# Patient Record
Sex: Female | Born: 1970 | Race: Black or African American | Hispanic: No | Marital: Single | Smoking: Never smoker
Health system: Southern US, Community
[De-identification: ages and names within clinical notes are randomized; demographics above are authoritative.]

## PROBLEM LIST (undated history)

## (undated) HISTORY — PX: PARTIAL HYSTERECTOMY: SHX80

---

## 1997-12-16 ENCOUNTER — Other Ambulatory Visit: Admission: RE | Admit: 1997-12-16 | Discharge: 1997-12-16 | Payer: Self-pay | Admitting: Obstetrics

## 1998-03-12 ENCOUNTER — Emergency Department (HOSPITAL_COMMUNITY): Admission: EM | Admit: 1998-03-12 | Discharge: 1998-03-12 | Payer: Self-pay | Admitting: Emergency Medicine

## 1998-05-31 ENCOUNTER — Ambulatory Visit (HOSPITAL_COMMUNITY): Admission: RE | Admit: 1998-05-31 | Discharge: 1998-05-31 | Payer: Self-pay | Admitting: Obstetrics

## 1998-07-21 ENCOUNTER — Emergency Department (HOSPITAL_COMMUNITY): Admission: EM | Admit: 1998-07-21 | Discharge: 1998-07-21 | Payer: Self-pay | Admitting: Emergency Medicine

## 2001-04-17 ENCOUNTER — Other Ambulatory Visit: Admission: RE | Admit: 2001-04-17 | Discharge: 2001-04-17 | Payer: Self-pay | Admitting: Obstetrics and Gynecology

## 2003-01-26 ENCOUNTER — Encounter: Payer: Self-pay | Admitting: *Deleted

## 2003-01-26 ENCOUNTER — Inpatient Hospital Stay (HOSPITAL_COMMUNITY): Admission: AD | Admit: 2003-01-26 | Discharge: 2003-01-26 | Payer: Self-pay | Admitting: *Deleted

## 2003-02-17 ENCOUNTER — Other Ambulatory Visit: Admission: RE | Admit: 2003-02-17 | Discharge: 2003-02-17 | Payer: Self-pay | Admitting: Gynecology

## 2003-04-09 ENCOUNTER — Ambulatory Visit (HOSPITAL_COMMUNITY): Admission: RE | Admit: 2003-04-09 | Discharge: 2003-04-09 | Payer: Self-pay | Admitting: Gynecology

## 2003-04-09 ENCOUNTER — Encounter (INDEPENDENT_AMBULATORY_CARE_PROVIDER_SITE_OTHER): Payer: Self-pay

## 2003-04-09 ENCOUNTER — Ambulatory Visit (HOSPITAL_BASED_OUTPATIENT_CLINIC_OR_DEPARTMENT_OTHER): Admission: RE | Admit: 2003-04-09 | Discharge: 2003-04-09 | Payer: Self-pay | Admitting: Gynecology

## 2003-06-16 ENCOUNTER — Observation Stay (HOSPITAL_COMMUNITY): Admission: RE | Admit: 2003-06-16 | Discharge: 2003-06-17 | Payer: Self-pay | Admitting: Gynecology

## 2003-06-16 ENCOUNTER — Encounter (INDEPENDENT_AMBULATORY_CARE_PROVIDER_SITE_OTHER): Payer: Self-pay

## 2003-06-16 ENCOUNTER — Encounter (INDEPENDENT_AMBULATORY_CARE_PROVIDER_SITE_OTHER): Payer: Self-pay | Admitting: Specialist

## 2008-06-01 ENCOUNTER — Ambulatory Visit (HOSPITAL_COMMUNITY): Admission: RE | Admit: 2008-06-01 | Discharge: 2008-06-01 | Payer: Self-pay | Admitting: Family Medicine

## 2008-08-16 ENCOUNTER — Emergency Department (HOSPITAL_COMMUNITY): Admission: EM | Admit: 2008-08-16 | Discharge: 2008-08-16 | Payer: Self-pay | Admitting: Emergency Medicine

## 2010-11-17 NOTE — H&P (Signed)
   NAMEAARIYAH, [name]                          ACCOUNT NO.:  [id number]   MEDICAL RECORD NO.:  [id number]                   PATIENT TYPE:  AMB   LOCATION:  [facility]                                 FACILITY:  [facility]   PHYSICIAN:  Timothy P. Fontaine, M.D.           DATE OF BIRTH:  12/14/1970   DATE OF ADMISSION:  04/09/2003  DATE OF DISCHARGE:                                HISTORY & PHYSICAL   CHIEF COMPLAINT:  Menorrhagia.   HISTORY OF PRESENT ILLNESS:  The patient is a 40 year old G3, P3 female,  status post postpartum tubal sterilization who presents with a history of  one year of worsening periods, increasing dysmenorrhea, and cramping with  heavier flows.  She was evaluated in the emergency room for heavy bleeding,  was found to have a hemoglobin of 8.1.  Her evaluation as an outpatient  included a normal Pap smear, normal prolactin, FSH, and TSH, with a repeated  hemoglobin of 7.4.  She was begun on iron and underwent a sonohistogram  which shows a large intracavitary polyp measuring 26 x 27 x 15 mm.  The  patient is admitted at this time for hysteroscopic evaluation and resection  of the polyp.   PAST MEDICAL HISTORY:  Uncomplicated.   PAST SURGICAL HISTORY:  1. Postpartum tubal sterilization.  2. D&C.   ALLERGIES:  No medications.   CURRENT MEDICATIONS:  None.   REVIEW OF SYSTEMS:  Noncontributory.   SOCIAL HISTORY:  Noncontributory.   FAMILY HISTORY:  Noncontributory.   ADMISSION PHYSICAL EXAMINATION:  VITAL SIGNS:  Afebrile.  Vital signs are  stable.  HEENT:  Normal.  LUNGS:  Clear.  CARDIAC:  Regular rate without murmurs, rubs, or gallops.  ABDOMEN:  Benign.  PELVIC:  External BUS, vagina normal.  Cervix normal.  Uterus normal size,  nontender.  Adnexa without masses or tenderness.  Rectovaginal exam is  normal.   ASSESSMENT:  A 40 year old G3, P3 female, worsening periods, menorrhagia,  with large intracavitary defect consistent with a large polyp for  hysteroscopic evaluation and resection.  Risks, benefits, indications, and  alternatives for the procedure were reviewed with the patient, to include  the expected intraoperative and postoperative courses.  The risks of  bleeding, transfusion, infection, uterine perforation, damage to internal  organs,  including major exploratory reparative surgeries and future reparative  surgeries, including ostomy formation was discussed, understood, and  accepted.  The patient's questions were answered to her satisfaction.  She  is ready to proceed with surgery.                                               Timothy P. Audie Box, M.D.    TPF/MEDQ  D:  04/05/2003  T:  04/05/2003  Job:  [id number]

## 2010-11-17 NOTE — Discharge Summary (Signed)
[name], [name]                          ACCOUNT NO.:  [id number]   MEDICAL RECORD NO.:  [id number]                   PATIENT TYPE:  OBV   LOCATION:  9322                                 FACILITY:  [facility]   PHYSICIAN:  Timothy P. Fontaine, M.D.           DATE OF BIRTH:  April 10, 1971   DATE OF ADMISSION:  06/16/2003  DATE OF DISCHARGE:  06/17/2003                                 DISCHARGE SUMMARY   DISCHARGE DIAGNOSES:  1. 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  2. Anemia status post.  3. Total vaginal hysterectomy with Dr. Audie Box on June 16, 2003.   HISTORY OF PRESENT ILLNESS:  This is a 40 year old female gravida 3, para 3  with history of tubal sterilization with increase in dysmenorrhea and  menorrhagia.  Underwent sonohysterogram which showed a large endometrial  polyp which was resected April 09, 2003.  On pathology results, was  initially concerning which warranted referral to Dr. Doran Durand at [facility].  Review is subsequently sent for Dr. Ursula Beath in [facility] for review.  They found there were some worrisome ____________, and  although they were not willing to make a specific diagnosis for  adenosarcoma, they felt it was very concerning for that.  Therefore, it was  felt to proceed with definitive surgery.   HOSPITAL COURSE:  On June 16, 2003, the patient underwent a total  vaginal hysterectomy without complications by Dr. Colin Broach.  There  was noted to be a small cyst on a pedunculated stalk.  The right fallopian  tube was excised.  Postoperatively, the patient remained febrile and in  stable condition, was discharged to home on June 16, 2003, and given  instructions.  On June 17, 2003, hemoglobin was 8.4, preoperative  hemoglobin on June 14, 2003, was 9.0.  Pathology report of the uterus  and cervix showed no pathologic abnormalities identified for  nonproliferative endometrium.  Myometrium. With no  pathologic abnormalities.  Fallopian tube cyst right pseudocyst.  Benign paratubal cyst and no tumor  identified.   DISPOSITION:  The patient was discharged to home.  Given prescription for  Tylox p.r.n. pain.  Return to office in two weeks.     Susa Loffler, P.A.                    Timothy P. Audie Box, M.D.    Ardath Sax  D:  07/12/2003  T:  07/12/2003  Job:  [id number]

## 2010-11-17 NOTE — H&P (Signed)
[name], [name]                          ACCOUNT NO.:  [id number]   MEDICAL RECORD NO.:  [id number]                   PATIENT TYPE:  OBV   LOCATION:  NA                                   FACILITY:  [facility]   PHYSICIAN:  Timothy P. Fontaine, M.D.           DATE OF BIRTH:  March 24, 1971   DATE OF ADMISSION:  DATE OF DISCHARGE:                                HISTORY & PHYSICAL   ANTICIPATED DATE OF ADMISSION:  Being admitted to [facility] December  15th at 12:30 for surgery.   CHIEF COMPLAINT:  Endometrial polyps suspicious for adenosarcoma.   HISTORY OF PRESENT ILLNESS:  A 40 year old G 3, P 3 female, history of tubal  sterilization with a history of increasing dysmenorrhea and menorrhagia.  The patient underwent a sonohysterogram which showed a large endometrial  polyp.  She subsequently underwent hysteroscopic resection April 09, 2003.  Pathology results were initially concerning which warranted referral to Dr.  Doran Durand at [facility] for review who subsequently sent her to Dr. Ursula Beath at [facility] for review.  They find that there is  some worrisome atypia and although they are not willing to make the ultimate  diagnosis of adenosarcoma they feel that it is very concerning for this.  All of this was discussed with the patient and we all feel it is most  prudent to proceed with a simple hysterectomy for subsequent diagnosis and  ultimate therapy.  The case was reviewed with De Blanch, M.D.  who on history feels that no further surgery than hysterectomy is needed.  That we do not need to remove her ovaries or lymph nodes.  The patient is  admitted at this time for a vaginal hysterectomy.   PAST SURGICAL HISTORY:  Postpartum tubal sterilization.   PAST MEDICAL HISTORY:  None.   No medications.   REVIEW OF SYSTEMS:  Noncontributory.   SOCIAL HISTORY:  Noncontributory.   FAMILY HISTORY:  Noncontributory.   ADMISSION PHYSICAL  EXAMINATION:  HEENT:  Normal.  LUNGS:  Clear.  CARDIAC:  Regular rate without rubs, murmurs, or gallops.  ABDOMEN:  Benign.  PELVIC:  External BUS, vagina normal.  Cervix normal.  Uterus normal size,  nontender.  Adnexa without masses or tenderness.   ASSESSMENT:  A 40 year old G 3, P 3 female status post tubal sterilization,  a large polyp suspicious for adenosarcoma for a hysterectomy.  The risks,  benefits, indications, and alternatives for the procedure were reviewed with  her to include the expected intraoperative, postoperative courses.  I  reviewed with her that there were no guarantees as far as pathology were  concerned.  That we may on final pathology find no further abnormalities in  the uterus, persistence of atypical changes, or frank carcinoma which would  require a subsequent re-operation.  The patient understands these issues and  accepts them.  She also understands there are no guarantees as far as  vaginal approach and that any time during the procedure if it is felt unsafe  to proceed with a vaginal hysterectomy that we would pursue an abdominal  approach and she understands and accepts this.  Have also counseled her that  if there is overt pathology with her ovaries that I may need to remove one  or both ovaries at the time of the procedure.  And although she prefers to  keep both ovaries, she accepts the possibility of oophorectomy.  The  immediate intraoperative/postoperative risks were reviewed with her to  include the risks of bleeding and the risk of transfusion.  The risk of  transfusion reaction, hepatitis, HIV, mad cow disease and other unknown  entities were reviewed, understood, and accepted. The risk of infection both  internal requiring prolonged antibiotics, abscess formation requiring  opening and draining of incisions and if indeed an abdominal incision is  made the risks for wound complications, open and draining of the incision,  and closure by  secondary intention.  The risks of inadvertent injury to  internal organs was reviewed with her and the risks of injury to bowel,  bladder, ureters, vessels, and nerves necessitating major exploratory  reparative surgeries and future reparative surgeries including ostomy  formation was all discussed, understood, and accepted.  Sexuality following  hysterectomy was also reviewed with her and the potential for orgasmic  dysfunction as well as persistent dyspareunia was discussed, understood, and  accepted.  Finally, although she has had a tubal sterilization, I reviewed  the absolute sterility associated with hysterectomy and she understands and  accepts this.  The patient's questions were answered to her satisfaction and  she is ready to proceed with surgery.                                               Timothy P. Audie Box, M.D.    TPF/MEDQ  D:  06/10/2003  T:  06/10/2003  Job:  [id number]

## 2010-11-17 NOTE — Op Note (Signed)
[name], [name]                          ACCOUNT NO.:  [id number]   MEDICAL RECORD NO.:  [id number]                   PATIENT TYPE:  OBV   LOCATION:  9399                                 FACILITY:  [facility]   PHYSICIAN:  Timothy P. Fontaine, M.D.           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   PROCEDURE:  Total vaginal hysterectomy.   SURGEON:  Timothy P. Fontaine, M.D.   ASSISTANT:  Rande Brunt. Eda Paschal, M.D.   ANESTHESIA:  General endotracheal.   ESTIMATED BLOOD LOSS:  Less than 100 mL.   COMPLICATIONS:  None.   SPECIMENS:  1. Hydatid cyst, right fallopian tube.  2. Cell washings.  3. Uterus.   FINDINGS:  Uterus grossly normal, mildly boggy, no visual or palpable  abnormalities.  Right and left ovaries grossly normal, free and mobile.  Right and left fallopian tube segments grossly normal.  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  Peritoneal surface  in the cul-de-sac to limited inspection is grossly normal.   DESCRIPTION OF PROCEDURE:  The patient was taken to the operating room,  under general endotracheal anesthesia was placed in the dorsal lithotomy  position, received a perineal and vaginal preparation with Betadine  solution, bladder emptied with in-and-out Foley catheterization, and the  patient was draped in the usual fashion.  The EUA was performed, the vagina  visualized with speculum.  Cervix grasped with a tenaculum and  circumferentially injected with 1% lidocaine, 1:100,000 epinephrine  solution, approximately 10 mL.  The cervical mucosa was then  circumferentially incised and the paracervical plane sharply developed.  The  posterior cul-de-sac was then sharply entered  without difficulty and a long  weighted speculum was placed.  The right and left uterosacral ligaments were  identified and clamped, cut, and ligated using 0 Vicryl suture, and tagged  for future reference.  The anterior cul-de-sac was then sharply developed  and subsequently sharply entered without difficulty.  The uterus was then  progressively freed from its attachments through clamping, cutting, and  ligating of the paracervical, parametrial tissues, using 0 Vicryl suture.  The uterus was then delivered through the vagina and the right and left  uterine-ovarian pedicles were then clamped and cut and the uterus was  excised.  The uterine-ovarian pedicles were then doubly ligated bilaterally  using 0 Vicryl in a single stitch followed by suture ligature.  The pedicles  were inspected and noted to show adequate hemostasis.  A tagged tail sponge  was then placed within the peritoneal cavity for visualization, the long  weighted speculum replaced with a short weighted speculum, and the posterior  vaginal cuff was run from uterosacral ligament to uterosacral ligament  using  0 Vicryl suture in a running interlocking stitch.  The tail sponge was  removed and the cul-de-sac irrigated, showing adequate hemostasis, and the  vagina was closed anterior to posterior using 0 Vicryl suture in interrupted  figure-of-eight stitch.  A cell washing was taken and sent as a permanent  with heparin upon entry into the peritoneal cavity.  A Foley catheter was  placed, free-flowing clear yellow urine noted.  The patient placed in the  supine position, awakened without difficulty, and taken to the recovery room  in good condition, having tolerated the procedure well.                                               Timothy P. Audie Box, M.D.    TPF/MEDQ  D:  06/16/2003  T:  06/16/2003  Job:  [id number]

## 2010-11-17 NOTE — Op Note (Signed)
   [name], [name]                          ACCOUNT NO.:  [id number]   MEDICAL RECORD NO.:  [id number]                   PATIENT TYPE:  AMB   LOCATION:  [facility]                                 FACILITY:  [facility]   PHYSICIAN:  Timothy P. Fontaine, M.D.           DATE OF BIRTH:  Mar 30, 1971   DATE OF PROCEDURE:  04/09/2003  DATE OF DISCHARGE:                                 OPERATIVE REPORT   PREOPERATIVE DIAGNOSES:  Menorrhagia.  Endometrial polyp.   POSTOPERATIVE DIAGNOSES:  Menorrhagia.  Endometrial polyp.   PROCEDURE:  1. Hysteroscopic resection, endometrial polyp.  2. D&C.   SURGEON:  Timothy P. Fontaine, M.D.   ANESTHETIC:  General.   ESTIMATED BLOOD LOSS:  Minimal.   COMPLICATIONS:  None.   SPECIMENS:  1. Polyp fragments.  2. Endometrial curettings.   SORBITOL DISCREPANCY:  45 mL.   FINDINGS:  Large endometrial polyp filling the cavity from mid fundal  portion, postresection.  Hysteroscopy adequate and normal, noting right and  left tubal ostia, fundus, anterior and posterior uterine surfaces, lower  uterine segment, endocervical canal all visualized and normal.   DESCRIPTION OF PROCEDURE:  The patient was taken to the operating room  underwent general anesthesia, was placed in the low dorsal lithotomy  position, received a perineal vaginal preparation with Betadine solution,  bladder emptied with in and out Foley catheterization, and EUA performed.  The patient was draped in the usual fashion, cervix visualized with a  speculum, anterior lip grasped with a single-tooth tenaculum, and the cervix  was gently and gradually dilated to admit the operative hysteroscope.  Hysteroscopy was performed with findings noted above.  Through multiple  passes with the right angle resectoscopic loop, the polyp was excised to the  level of the surrounding endometrium.  A sharp curettage was then performed,  and the final re-hysteroscopy showed an empty  cavity, good distention,  no evidence of perforation.  The instruments were  removed, adequate hemostasis visualized, the patient placed in supine  position, awakened without difficulty, and taken to recovery room in good  condition having tolerated the procedure well.                                               Timothy P. Audie Box, M.D.    TPF/MEDQ  D:  04/09/2003  T:  04/09/2003  Job:  [id number]

## 2011-01-31 ENCOUNTER — Inpatient Hospital Stay (HOSPITAL_COMMUNITY)
Admission: RE | Admit: 2011-01-31 | Discharge: 2011-01-31 | Disposition: A | Discharge status: Home / Self Care | Payer: Self-pay | Source: Ambulatory Visit | Attending: Family Medicine | Admitting: Family Medicine

## 2011-02-01 ENCOUNTER — Inpatient Hospital Stay (INDEPENDENT_AMBULATORY_CARE_PROVIDER_SITE_OTHER)
Admission: RE | Admit: 2011-02-01 | Discharge: 2011-02-01 | Disposition: A | Discharge status: Home / Self Care | Payer: Self-pay | Source: Ambulatory Visit | Attending: Emergency Medicine | Admitting: Emergency Medicine

## 2011-02-01 DIAGNOSIS — N644 Mastodynia: Secondary | ICD-10-CM

## 2011-02-01 DIAGNOSIS — N760 Acute vaginitis: Secondary | ICD-10-CM

## 2011-02-01 DIAGNOSIS — A499 Bacterial infection, unspecified: Secondary | ICD-10-CM

## 2011-02-01 DIAGNOSIS — N952 Postmenopausal atrophic vaginitis: Secondary | ICD-10-CM

## 2011-02-01 LAB — POCT URINALYSIS DIP (DEVICE)
Glucose, UA: NEGATIVE mg/dL
Hgb urine dipstick: NEGATIVE
Ketones, ur: NEGATIVE mg/dL
Protein, ur: NEGATIVE mg/dL
Specific Gravity, Urine: 1.02 (ref 1.005–1.030)
Urobilinogen, UA: 2 mg/dL — ABNORMAL HIGH (ref 0.0–1.0)
pH: 7.5 (ref 5.0–8.0)

## 2011-02-01 LAB — WET PREP, GENITAL
Clue Cells Wet Prep HPF POC: NONE SEEN
Trich, Wet Prep: NONE SEEN
Yeast Wet Prep HPF POC: NONE SEEN

## 2013-09-14 ENCOUNTER — Other Ambulatory Visit: Payer: Self-pay | Admitting: Family Medicine

## 2013-09-14 DIAGNOSIS — N644 Mastodynia: Secondary | ICD-10-CM

## 2013-09-16 ENCOUNTER — Ambulatory Visit
Admission: RE | Admit: 2013-09-16 | Discharge: 2013-09-16 | Disposition: A | Discharge status: Home / Self Care | Payer: BC Managed Care – PPO | Source: Ambulatory Visit | Attending: Family Medicine | Admitting: Family Medicine

## 2013-09-16 ENCOUNTER — Other Ambulatory Visit: Payer: Self-pay | Admitting: Family Medicine

## 2013-09-16 DIAGNOSIS — N644 Mastodynia: Secondary | ICD-10-CM

## 2014-11-17 ENCOUNTER — Other Ambulatory Visit: Payer: Self-pay | Admitting: Endocrinology

## 2014-11-17 DIAGNOSIS — E041 Nontoxic single thyroid nodule: Secondary | ICD-10-CM

## 2015-01-17 ENCOUNTER — Other Ambulatory Visit: Payer: Self-pay | Admitting: Endocrinology

## 2015-01-17 DIAGNOSIS — E041 Nontoxic single thyroid nodule: Secondary | ICD-10-CM

## 2015-01-19 ENCOUNTER — Inpatient Hospital Stay: Admission: RE | Admit: 2015-01-19 | Payer: BC Managed Care – PPO | Source: Ambulatory Visit

## 2015-02-09 ENCOUNTER — Ambulatory Visit
Admission: RE | Admit: 2015-02-09 | Discharge: 2015-02-09 | Disposition: A | Discharge status: Home / Self Care | Payer: No Typology Code available for payment source | Source: Ambulatory Visit | Attending: Endocrinology | Admitting: Endocrinology

## 2015-02-09 ENCOUNTER — Inpatient Hospital Stay: Admission: RE | Admit: 2015-02-09 | Payer: BC Managed Care – PPO | Source: Ambulatory Visit

## 2015-02-09 ENCOUNTER — Other Ambulatory Visit (HOSPITAL_COMMUNITY)
Admission: RE | Admit: 2015-02-09 | Discharge: 2015-02-09 | Disposition: A | Discharge status: Home / Self Care | Payer: Medicaid Other | Source: Ambulatory Visit | Attending: Interventional Radiology | Admitting: Interventional Radiology

## 2015-02-09 DIAGNOSIS — E041 Nontoxic single thyroid nodule: Secondary | ICD-10-CM | POA: Diagnosis not present

## 2016-10-26 ENCOUNTER — Other Ambulatory Visit: Payer: Self-pay | Admitting: Family Medicine

## 2016-10-26 DIAGNOSIS — Z1231 Encounter for screening mammogram for malignant neoplasm of breast: Secondary | ICD-10-CM

## 2016-11-19 ENCOUNTER — Ambulatory Visit
Admission: RE | Admit: 2016-11-19 | Discharge: 2016-11-19 | Disposition: A | Discharge status: Home / Self Care | Payer: 59 | Source: Ambulatory Visit | Attending: Family Medicine | Admitting: Family Medicine

## 2016-11-19 DIAGNOSIS — Z1231 Encounter for screening mammogram for malignant neoplasm of breast: Secondary | ICD-10-CM

## 2017-09-11 ENCOUNTER — Ambulatory Visit (HOSPITAL_COMMUNITY)
Admission: EM | Admit: 2017-09-11 | Discharge: 2017-09-11 | Disposition: A | Discharge status: Home / Self Care | Payer: 59 | Attending: Family Medicine | Admitting: Family Medicine

## 2017-09-11 ENCOUNTER — Encounter (HOSPITAL_COMMUNITY): Payer: Self-pay | Admitting: Emergency Medicine

## 2017-09-11 DIAGNOSIS — L0231 Cutaneous abscess of buttock: Secondary | ICD-10-CM | POA: Diagnosis not present

## 2017-09-11 MED ORDER — SULFAMETHOXAZOLE-TRIMETHOPRIM 800-160 MG PO TABS: 1.0000 | ORAL_TABLET | Freq: Two times a day (BID) | ORAL | 0 refills | Status: AC

## 2017-09-11 MED ORDER — HYDROCODONE-ACETAMINOPHEN 5-325 MG PO TABS: 1.0000 | ORAL_TABLET | Freq: Four times a day (QID) | ORAL | 0 refills | Status: AC | PRN

## 2017-09-11 NOTE — Discharge Instructions (Signed)
You have had an abscess drained today and you may have had packing placed in the wound to help the abscess continue to drain at home. If packing was placed, please do not remove it. You may shower. Let the soapy water clean your wound. Do not scrub it. Keep your wound covered. Follow up at the Urgent Care in 2 days for a wound check. Return to the Urgent Care immediately if you develop any of the following symptoms: fever, Increased redness or swelling around where your abscess was, increased pain, or generalized weakness or vomiting.

## 2017-09-11 NOTE — ED Notes (Signed)
Chaperon provider with abscess drainage and bandaging.

## 2017-09-11 NOTE — ED Provider Notes (Signed)
  [facility]   [id number] 09/11/17 Arrival Time: 1925  ASSESSMENT & PLAN:  1. Abscess of buttock, right     Meds ordered this encounter  Medications  . sulfamethoxazole-trimethoprim (BACTRIM DS,SEPTRA DS) 800-160 MG tablet    Sig: Take 1 tablet by mouth 2 (two) times daily for 10 days.    Dispense:  20 tablet    Refill:  0  . HYDROcodone-acetaminophen (NORCO/VICODIN) 5-325 MG tablet    Sig: Take 1 tablet by mouth every 6 (six) hours as needed for moderate pain or severe pain.    Dispense:  8 tablet    Refill:  0   Procedure: Verbal consent obtained. Area over induration cleaned with betadine. Lidocaine 2% without epinephrine used to obtain local anesthesia. The most fluctuant portion of the abscess was incised with a #11 blade scalpel. Minimal amount of pus expressed. Skin very thickened. Abscess cavity not large enough for packing. Wound dressed with a clean gauze dressing. Minimal bleeding. No complications.  Wound care instructions discussed and given in written format. To return in 48 hours for wound check.  Finish all antibiotics. OTC analgesics as needed.  Pain medication sedation precautions.  Reviewed expectations re: course of current medical issues. Questions answered. Outlined signs and symptoms indicating need for more acute intervention. Patient verbalized understanding. After Visit Summary given.   SUBJECTIVE:  [name] is a 47 y.o. female who presents with a possible abscess of her inferior R buttock. Onset gradual, approximately 2 days ago. Very painful/pressure. No drainage. Afebrile. OTC analgesics with mild relief.  ROS: As per HPI.  OBJECTIVE:  Vitals:   09/11/17 1959  BP: 116/85  Pulse: 96  Resp: 18  Temp: 99.2 F (37.3 C)  SpO2: 100%     General appearance: alert; no distress Skin: 4 x 2 cm induration of her inferior R buttock; tender to touch; no active drainage Psychological: alert and cooperative; normal mood and  affect  No Known Allergies   Social History   Socioeconomic History  . Marital status: Single    Spouse name: None  . Number of children: None  . Years of education: None  . Highest education level: None  Social Needs  . Financial resource strain: None  . Food insecurity - worry: None  . Food insecurity - inability: None  . Transportation needs - medical: None  . Transportation needs - non-medical: None  Occupational History  . None  Tobacco Use  . Smoking status: Never Smoker  Substance and Sexual Activity  . Alcohol use: No    Frequency: Never  . Drug use: None  . Sexual activity: None  Other Topics Concern  . None  Social History Narrative  . None    Past Surgical History:  Procedure Laterality Date  . PARTIAL HYSTERECTOMY             Mardella LaymanHagler, Hlee Fringer, MD 09/11/17 2030

## 2017-09-11 NOTE — ED Triage Notes (Signed)
Pt c/o abscess on R buttocks noticed yesterday

## 2018-01-29 ENCOUNTER — Other Ambulatory Visit: Payer: Self-pay | Admitting: Family Medicine

## 2018-01-29 DIAGNOSIS — Z1231 Encounter for screening mammogram for malignant neoplasm of breast: Secondary | ICD-10-CM

## 2018-02-13 ENCOUNTER — Ambulatory Visit
Admission: RE | Admit: 2018-02-13 | Discharge: 2018-02-13 | Disposition: A | Discharge status: Home / Self Care | Payer: 59 | Source: Ambulatory Visit | Attending: Family Medicine | Admitting: Family Medicine

## 2018-02-13 DIAGNOSIS — Z1231 Encounter for screening mammogram for malignant neoplasm of breast: Secondary | ICD-10-CM

## 2019-03-04 ENCOUNTER — Other Ambulatory Visit: Payer: Self-pay | Admitting: Family Medicine

## 2019-03-04 DIAGNOSIS — Z1231 Encounter for screening mammogram for malignant neoplasm of breast: Secondary | ICD-10-CM

## 2019-04-13 ENCOUNTER — Other Ambulatory Visit: Payer: Self-pay

## 2019-04-13 ENCOUNTER — Ambulatory Visit
Admission: RE | Admit: 2019-04-13 | Discharge: 2019-04-13 | Disposition: A | Discharge status: Home / Self Care | Payer: 59 | Source: Ambulatory Visit | Attending: Family Medicine | Admitting: Family Medicine

## 2019-04-13 DIAGNOSIS — Z1231 Encounter for screening mammogram for malignant neoplasm of breast: Secondary | ICD-10-CM

## 2020-12-05 ENCOUNTER — Other Ambulatory Visit: Payer: Self-pay | Admitting: Family Medicine

## 2020-12-05 DIAGNOSIS — Z1231 Encounter for screening mammogram for malignant neoplasm of breast: Secondary | ICD-10-CM

## 2020-12-06 ENCOUNTER — Ambulatory Visit
Admission: RE | Admit: 2020-12-06 | Discharge: 2020-12-06 | Disposition: A | Discharge status: Home / Self Care | Payer: 59 | Source: Ambulatory Visit | Attending: Family Medicine | Admitting: Family Medicine

## 2020-12-06 ENCOUNTER — Other Ambulatory Visit: Payer: Self-pay

## 2020-12-06 DIAGNOSIS — Z1231 Encounter for screening mammogram for malignant neoplasm of breast: Secondary | ICD-10-CM

## 2021-12-13 ENCOUNTER — Other Ambulatory Visit: Payer: Self-pay | Admitting: Family Medicine

## 2021-12-13 DIAGNOSIS — Z1231 Encounter for screening mammogram for malignant neoplasm of breast: Secondary | ICD-10-CM

## 2021-12-18 ENCOUNTER — Ambulatory Visit
Admission: RE | Admit: 2021-12-18 | Discharge: 2021-12-18 | Disposition: A | Discharge status: Home / Self Care | Payer: 59 | Source: Ambulatory Visit | Attending: Family Medicine | Admitting: Family Medicine

## 2021-12-18 DIAGNOSIS — Z1231 Encounter for screening mammogram for malignant neoplasm of breast: Secondary | ICD-10-CM

## 2022-11-30 ENCOUNTER — Other Ambulatory Visit: Payer: Self-pay | Admitting: Family Medicine

## 2022-11-30 DIAGNOSIS — Z1231 Encounter for screening mammogram for malignant neoplasm of breast: Secondary | ICD-10-CM

## 2023-01-16 ENCOUNTER — Ambulatory Visit
Admission: RE | Admit: 2023-01-16 | Discharge: 2023-01-16 | Disposition: A | Discharge status: Home / Self Care | Payer: 59 | Source: Ambulatory Visit | Attending: Family Medicine | Admitting: Family Medicine

## 2023-01-16 DIAGNOSIS — Z1231 Encounter for screening mammogram for malignant neoplasm of breast: Secondary | ICD-10-CM

## 2023-03-01 IMAGING — MG MM DIGITAL SCREENING BILAT W/ TOMO AND CAD
8 series · 8 of 24 positions shown · non-contrast
Comparison: Previous exam(s).

CLINICAL DATA: Screening.

EXAM:
DIGITAL SCREENING BILATERAL MAMMOGRAM WITH TOMOSYNTHESIS AND CAD
TECHNIQUE: Bilateral screening digital craniocaudal and mediolateral oblique
mammograms were obtained. Bilateral screening digital breast
tomosynthesis was performed. The images were evaluated with
computer-aided detection.

[R MLO synth-2D]
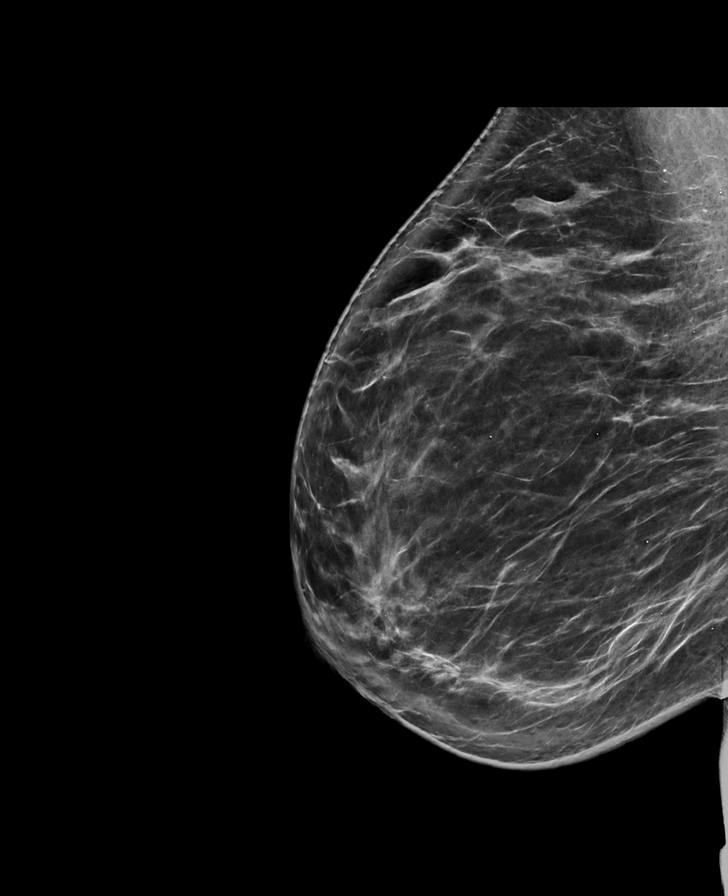

[L CC synth-2D]
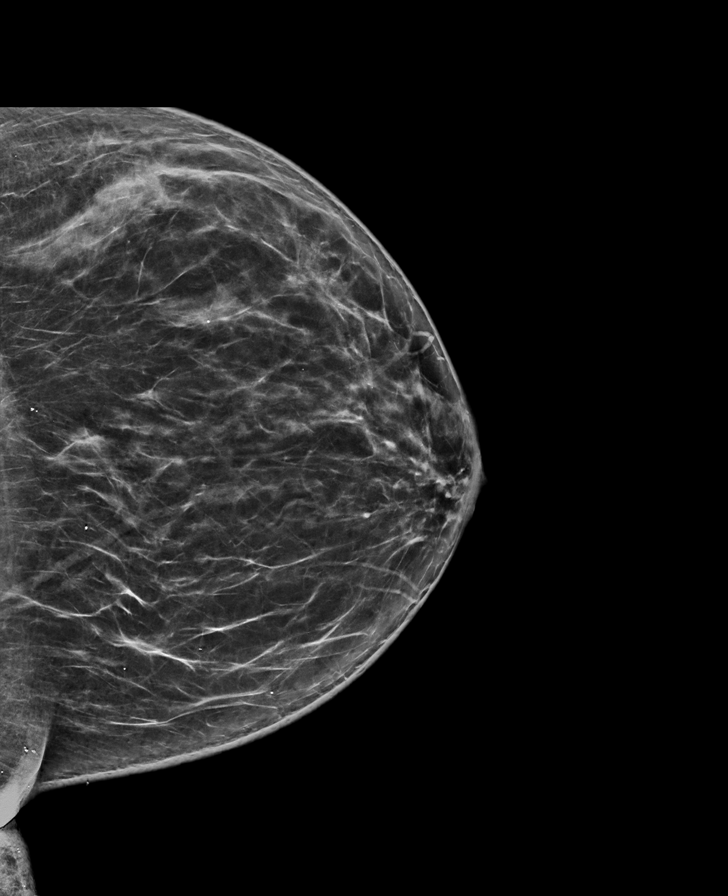

[R CC synth-2D]
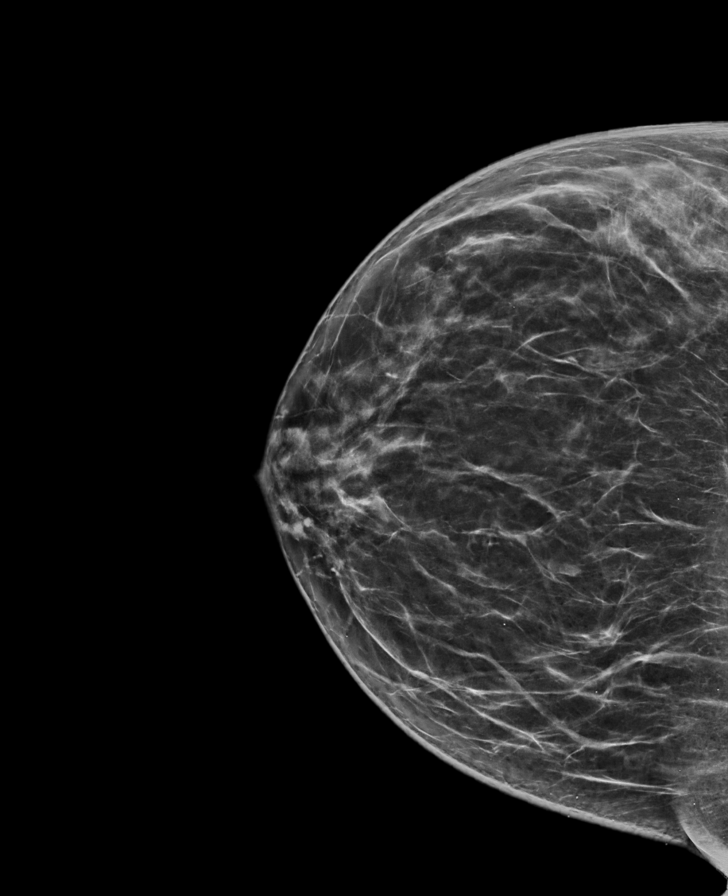

[L MLO synth-2D]
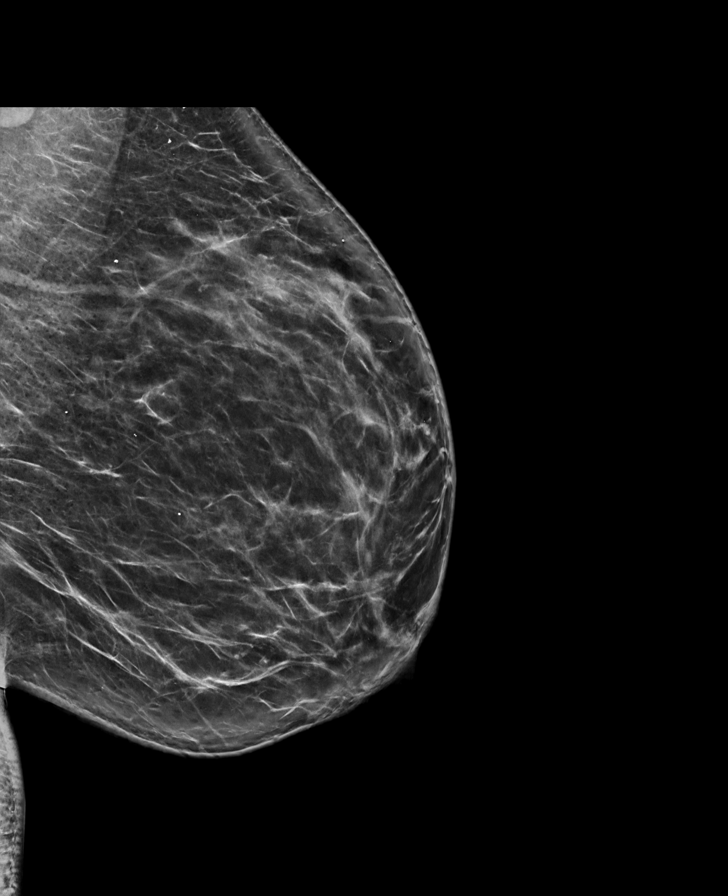

[L MLO tomo · tomo slice 47/93.0]
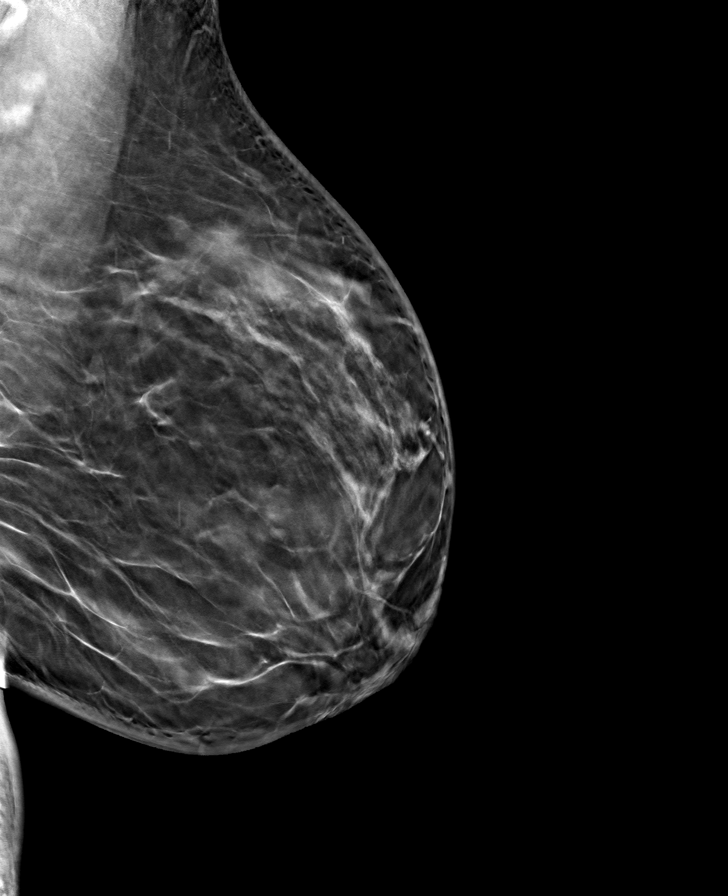

[R MLO tomo · tomo slice 49/98.0]
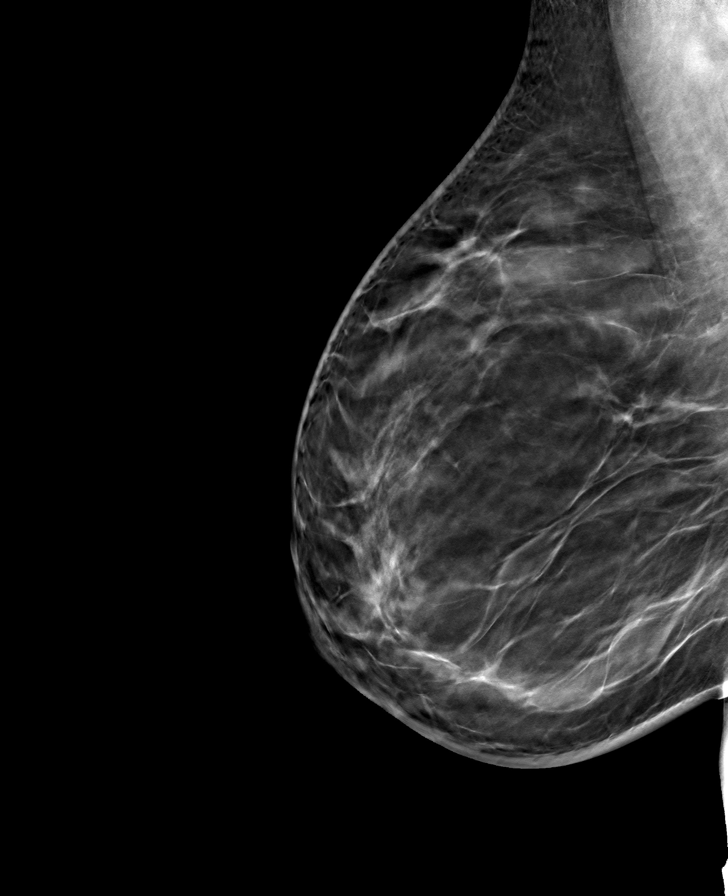

[R CC tomo · tomo slice 41/81.0]
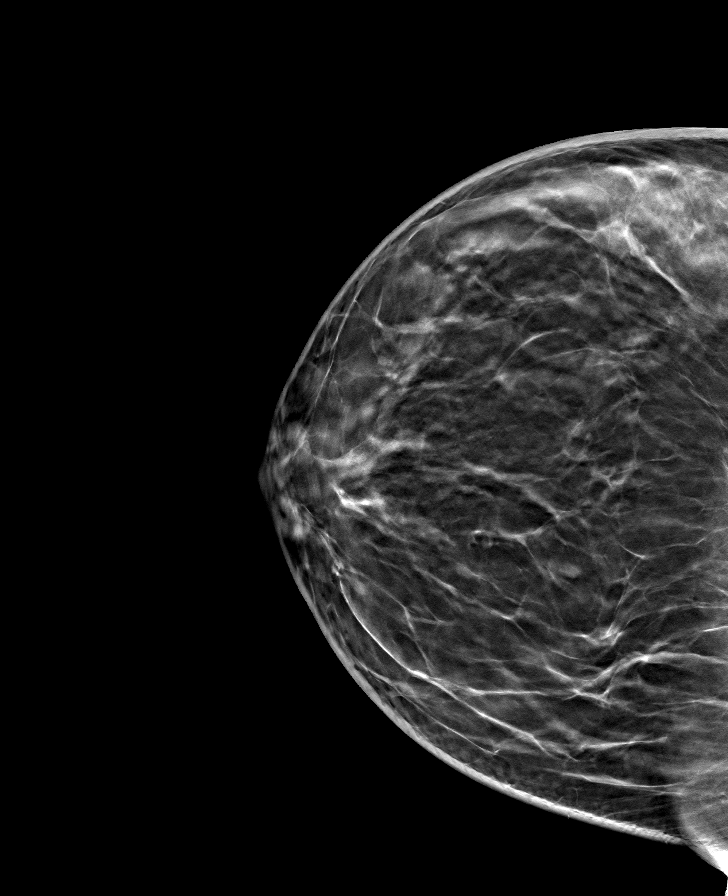

[L CC tomo · tomo slice 42/83.0]
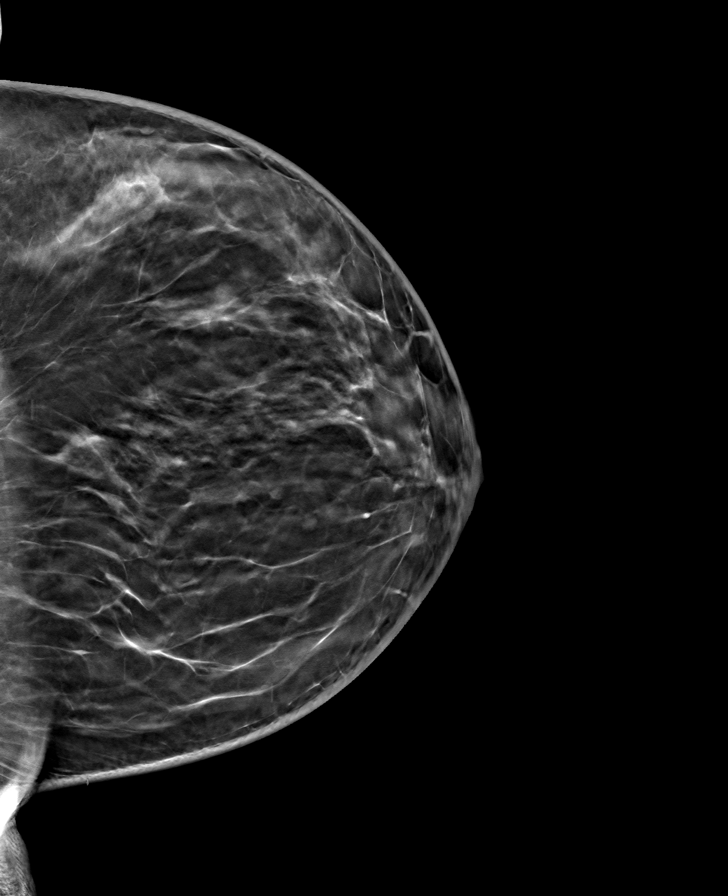

[8 of 24 positions shown; findings below may reference images not displayed]

ACR Breast Density Category b: There are scattered areas of
fibroglandular density.
FINDINGS: There are no findings suspicious for malignancy.
IMPRESSION: No mammographic evidence of malignancy. A result letter of this
screening mammogram will be mailed directly to the patient.

RECOMMENDATION:
Screening mammogram in one year. (Code:51-O-LD2)

BI-RADS CATEGORY  1: Negative.

## 2025-01-13 ENCOUNTER — Ambulatory Visit: Admitting: Physician Assistant
